# Patient Record
Sex: Female | Born: 1960 | Hispanic: No | Marital: Married
Health system: Southern US, Community
[De-identification: ages and names within clinical notes are randomized; demographics above are authoritative.]

---

## 1998-11-11 ENCOUNTER — Other Ambulatory Visit: Admission: RE | Admit: 1998-11-11 | Discharge: 1998-11-11 | Payer: Self-pay | Admitting: Obstetrics and Gynecology

## 2000-03-17 ENCOUNTER — Other Ambulatory Visit: Admission: RE | Admit: 2000-03-17 | Discharge: 2000-03-17 | Payer: Self-pay | Admitting: Obstetrics and Gynecology

## 2002-05-31 ENCOUNTER — Encounter: Payer: Self-pay | Admitting: Family Medicine

## 2002-05-31 ENCOUNTER — Encounter: Admission: RE | Admit: 2002-05-31 | Discharge: 2002-05-31 | Payer: Self-pay | Admitting: Family Medicine

## 2002-06-16 ENCOUNTER — Encounter: Admission: RE | Admit: 2002-06-16 | Discharge: 2002-06-16 | Payer: Self-pay | Admitting: Family Medicine

## 2002-06-16 ENCOUNTER — Encounter: Payer: Self-pay | Admitting: Family Medicine

## 2003-07-12 ENCOUNTER — Encounter: Admission: RE | Admit: 2003-07-12 | Discharge: 2003-07-12 | Payer: Self-pay | Admitting: Obstetrics and Gynecology

## 2003-07-12 ENCOUNTER — Encounter: Payer: Self-pay | Admitting: Obstetrics and Gynecology

## 2004-06-04 ENCOUNTER — Other Ambulatory Visit: Admission: RE | Admit: 2004-06-04 | Discharge: 2004-06-04 | Payer: Self-pay | Admitting: Obstetrics and Gynecology

## 2004-06-09 ENCOUNTER — Encounter: Admission: RE | Admit: 2004-06-09 | Discharge: 2004-06-09 | Payer: Self-pay | Admitting: Obstetrics and Gynecology

## 2005-07-17 ENCOUNTER — Encounter: Admission: RE | Admit: 2005-07-17 | Discharge: 2005-07-17 | Payer: Self-pay | Admitting: Obstetrics and Gynecology

## 2005-07-22 ENCOUNTER — Other Ambulatory Visit: Admission: RE | Admit: 2005-07-22 | Discharge: 2005-07-22 | Payer: Self-pay | Admitting: Obstetrics and Gynecology

## 2006-05-18 ENCOUNTER — Other Ambulatory Visit: Admission: RE | Admit: 2006-05-18 | Discharge: 2006-05-18 | Payer: Self-pay | Admitting: Obstetrics and Gynecology

## 2006-08-31 ENCOUNTER — Other Ambulatory Visit: Admission: RE | Admit: 2006-08-31 | Discharge: 2006-08-31 | Payer: Self-pay | Admitting: Obstetrics and Gynecology

## 2006-09-29 ENCOUNTER — Encounter: Admission: RE | Admit: 2006-09-29 | Discharge: 2006-09-29 | Payer: Self-pay | Admitting: Obstetrics and Gynecology

## 2007-09-06 ENCOUNTER — Other Ambulatory Visit: Admission: RE | Admit: 2007-09-06 | Discharge: 2007-09-06 | Payer: Self-pay | Admitting: Obstetrics and Gynecology

## 2008-09-10 ENCOUNTER — Other Ambulatory Visit: Admission: RE | Admit: 2008-09-10 | Discharge: 2008-09-10 | Payer: Self-pay | Admitting: Obstetrics and Gynecology

## 2008-10-29 ENCOUNTER — Encounter: Admission: RE | Admit: 2008-10-29 | Discharge: 2008-10-29 | Payer: Self-pay | Admitting: Obstetrics and Gynecology

## 2009-09-11 ENCOUNTER — Other Ambulatory Visit: Admission: RE | Admit: 2009-09-11 | Discharge: 2009-09-11 | Payer: Self-pay | Admitting: Obstetrics and Gynecology

## 2013-09-01 ENCOUNTER — Other Ambulatory Visit: Payer: Self-pay | Admitting: Family Medicine

## 2013-09-01 ENCOUNTER — Other Ambulatory Visit (HOSPITAL_COMMUNITY)
Admission: RE | Admit: 2013-09-01 | Discharge: 2013-09-01 | Disposition: A | Discharge status: Home / Self Care | Payer: BC Managed Care – PPO | Source: Ambulatory Visit | Attending: Family Medicine | Admitting: Family Medicine

## 2013-09-01 DIAGNOSIS — Z1151 Encounter for screening for human papillomavirus (HPV): Secondary | ICD-10-CM | POA: Insufficient documentation

## 2013-09-01 DIAGNOSIS — Z1231 Encounter for screening mammogram for malignant neoplasm of breast: Secondary | ICD-10-CM

## 2013-09-01 DIAGNOSIS — Z124 Encounter for screening for malignant neoplasm of cervix: Secondary | ICD-10-CM | POA: Insufficient documentation

## 2013-09-28 ENCOUNTER — Ambulatory Visit
Admission: RE | Admit: 2013-09-28 | Discharge: 2013-09-28 | Disposition: A | Discharge status: Home / Self Care | Payer: BC Managed Care – PPO | Source: Ambulatory Visit | Attending: Family Medicine | Admitting: Family Medicine

## 2013-09-28 DIAGNOSIS — Z1231 Encounter for screening mammogram for malignant neoplasm of breast: Secondary | ICD-10-CM

## 2014-05-31 ENCOUNTER — Other Ambulatory Visit: Payer: Self-pay | Admitting: Family Medicine

## 2014-05-31 DIAGNOSIS — R0989 Other specified symptoms and signs involving the circulatory and respiratory systems: Secondary | ICD-10-CM

## 2014-06-11 ENCOUNTER — Ambulatory Visit
Admission: RE | Admit: 2014-06-11 | Discharge: 2014-06-11 | Disposition: A | Discharge status: Home / Self Care | Payer: BC Managed Care – PPO | Source: Ambulatory Visit | Attending: Family Medicine | Admitting: Family Medicine

## 2014-06-11 DIAGNOSIS — R0989 Other specified symptoms and signs involving the circulatory and respiratory systems: Secondary | ICD-10-CM

## 2015-10-23 ENCOUNTER — Other Ambulatory Visit: Payer: Self-pay | Admitting: Family Medicine

## 2015-10-23 DIAGNOSIS — Z1231 Encounter for screening mammogram for malignant neoplasm of breast: Secondary | ICD-10-CM

## 2015-11-05 ENCOUNTER — Ambulatory Visit
Admission: RE | Admit: 2015-11-05 | Discharge: 2015-11-05 | Disposition: A | Discharge status: Home / Self Care | Payer: BC Managed Care – PPO | Source: Ambulatory Visit | Attending: Family Medicine | Admitting: Family Medicine

## 2015-11-05 DIAGNOSIS — Z1231 Encounter for screening mammogram for malignant neoplasm of breast: Secondary | ICD-10-CM

## 2017-03-25 ENCOUNTER — Other Ambulatory Visit: Payer: Self-pay | Admitting: Family Medicine

## 2017-03-25 DIAGNOSIS — M25561 Pain in right knee: Secondary | ICD-10-CM

## 2017-04-11 ENCOUNTER — Ambulatory Visit
Admission: RE | Admit: 2017-04-11 | Discharge: 2017-04-11 | Disposition: A | Discharge status: Home / Self Care | Payer: BC Managed Care – PPO | Source: Ambulatory Visit | Attending: Family Medicine | Admitting: Family Medicine

## 2017-04-11 DIAGNOSIS — M25561 Pain in right knee: Secondary | ICD-10-CM

## 2017-08-11 NOTE — Progress Notes (Signed)
Ms. Malissa HippoKelkar has gotten a flu shot today to LT deltoid at [facility] Lot# [id number] Mfg: [employer] NDC: [phone] [id number] Exp. 04/10/18

## 2018-02-22 ENCOUNTER — Other Ambulatory Visit (HOSPITAL_COMMUNITY)
Admission: RE | Admit: 2018-02-22 | Discharge: 2018-02-22 | Disposition: A | Discharge status: Home / Self Care | Payer: BC Managed Care – PPO | Source: Ambulatory Visit | Attending: Family Medicine | Admitting: Family Medicine

## 2018-02-22 ENCOUNTER — Other Ambulatory Visit: Payer: Self-pay | Admitting: Family Medicine

## 2018-02-22 DIAGNOSIS — Z1231 Encounter for screening mammogram for malignant neoplasm of breast: Secondary | ICD-10-CM

## 2018-02-22 DIAGNOSIS — Z124 Encounter for screening for malignant neoplasm of cervix: Secondary | ICD-10-CM | POA: Diagnosis present

## 2018-02-24 LAB — CYTOLOGY - PAP
Diagnosis: NEGATIVE
HPV: NOT DETECTED

## 2018-03-15 ENCOUNTER — Ambulatory Visit
Admission: RE | Admit: 2018-03-15 | Discharge: 2018-03-15 | Disposition: A | Discharge status: Home / Self Care | Payer: BC Managed Care – PPO | Source: Ambulatory Visit | Attending: Family Medicine | Admitting: Family Medicine

## 2018-03-15 DIAGNOSIS — Z1231 Encounter for screening mammogram for malignant neoplasm of breast: Secondary | ICD-10-CM

## 2018-09-06 NOTE — Progress Notes (Signed)
Ms Sandra Cherry received her flu shot on 09/02/18 to her LT deltoid by the undersigned at the [facility].  Lot#[id number] NDC:[id number] Mfg: [employer] Exp: 04/11/19

## 2019-11-23 ENCOUNTER — Ambulatory Visit: Payer: BC Managed Care – PPO

## 2020-04-22 ENCOUNTER — Other Ambulatory Visit: Payer: Self-pay | Admitting: Internal Medicine

## 2020-04-22 DIAGNOSIS — E785 Hyperlipidemia, unspecified: Secondary | ICD-10-CM

## 2020-05-15 ENCOUNTER — Ambulatory Visit
Admission: RE | Admit: 2020-05-15 | Discharge: 2020-05-15 | Disposition: A | Discharge status: Home / Self Care | Payer: BC Managed Care – PPO | Source: Ambulatory Visit | Attending: Internal Medicine | Admitting: Internal Medicine

## 2020-05-15 DIAGNOSIS — E785 Hyperlipidemia, unspecified: Secondary | ICD-10-CM

## 2020-10-10 ENCOUNTER — Other Ambulatory Visit: Payer: BC Managed Care – PPO

## 2021-09-10 ENCOUNTER — Other Ambulatory Visit: Payer: Self-pay | Admitting: Internal Medicine

## 2021-09-10 DIAGNOSIS — Z1231 Encounter for screening mammogram for malignant neoplasm of breast: Secondary | ICD-10-CM

## 2021-09-15 ENCOUNTER — Encounter: Payer: Self-pay | Admitting: Radiology

## 2021-09-15 ENCOUNTER — Ambulatory Visit
Admission: RE | Admit: 2021-09-15 | Discharge: 2021-09-15 | Disposition: A | Discharge status: Home / Self Care | Payer: BC Managed Care – PPO | Source: Ambulatory Visit | Attending: Internal Medicine | Admitting: Internal Medicine

## 2021-09-15 DIAGNOSIS — Z1231 Encounter for screening mammogram for malignant neoplasm of breast: Secondary | ICD-10-CM

## 2022-10-28 ENCOUNTER — Other Ambulatory Visit: Payer: Self-pay | Admitting: Internal Medicine

## 2022-10-28 DIAGNOSIS — Z0000 Encounter for general adult medical examination without abnormal findings: Secondary | ICD-10-CM

## 2023-02-19 IMAGING — MG MM DIGITAL SCREENING BILAT W/ TOMO AND CAD
8 series · 9 of 24 positions shown · non-contrast
Comparison: Previous exam(s).

CLINICAL DATA: Screening.

EXAM:
DIGITAL SCREENING BILATERAL MAMMOGRAM WITH TOMOSYNTHESIS AND CAD
TECHNIQUE: Bilateral screening digital craniocaudal and mediolateral oblique
mammograms were obtained. Bilateral screening digital breast
tomosynthesis was performed. The images were evaluated with
computer-aided detection.

[L CC synth-2D]
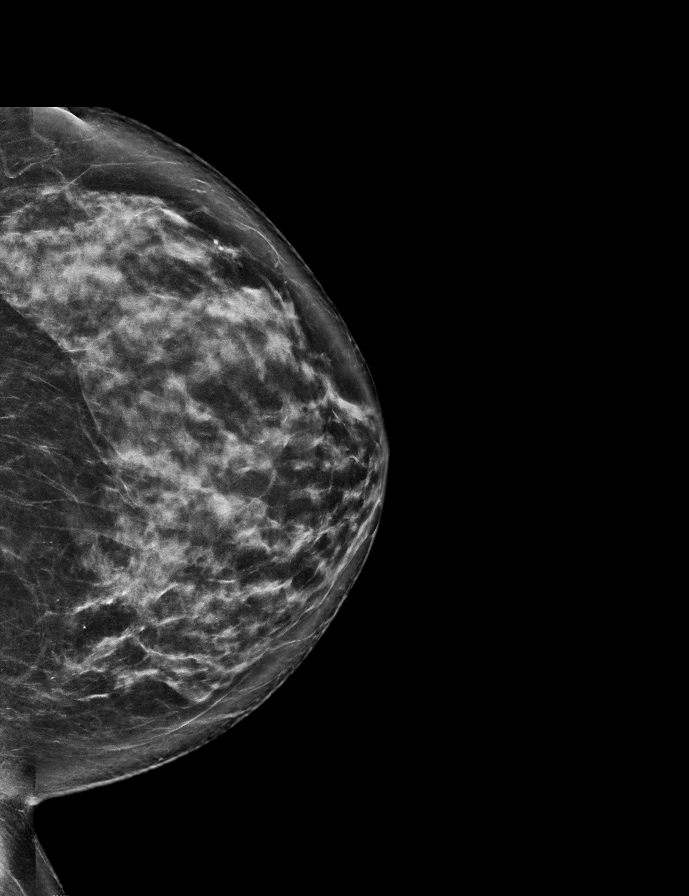

[L MLO synth-2D]
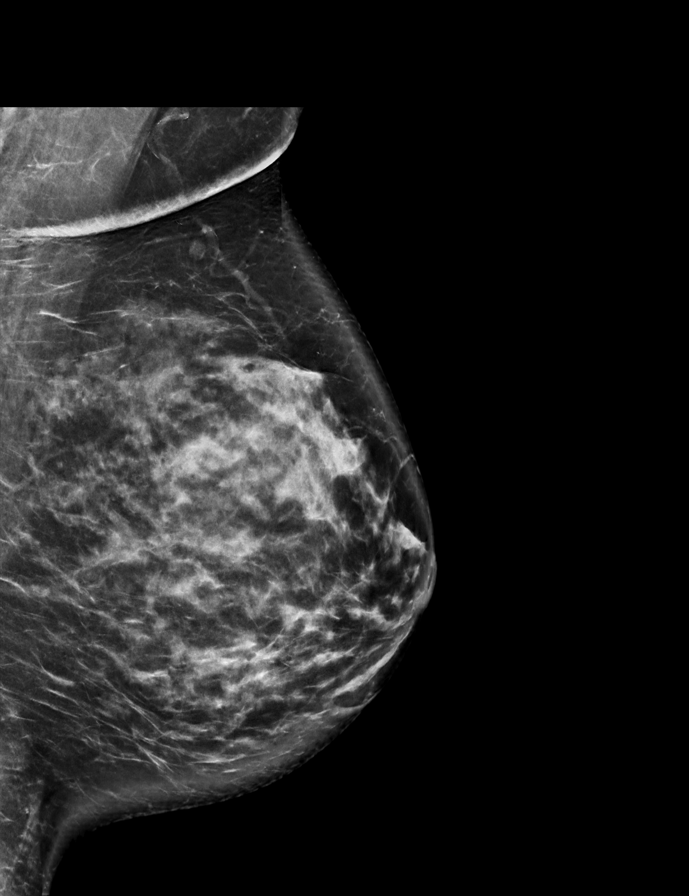

[R CC synth-2D]
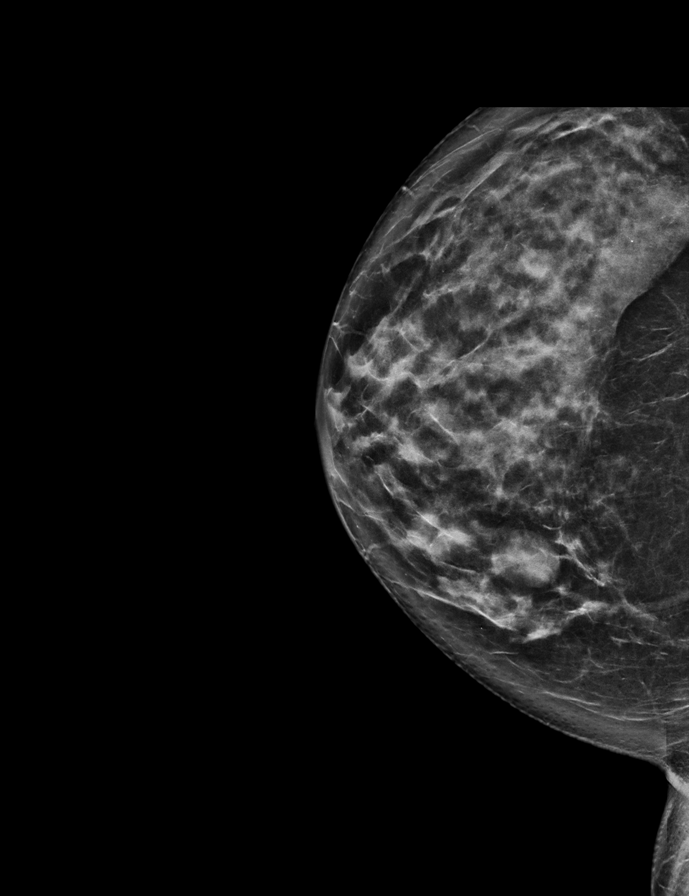

[R MLO synth-2D]
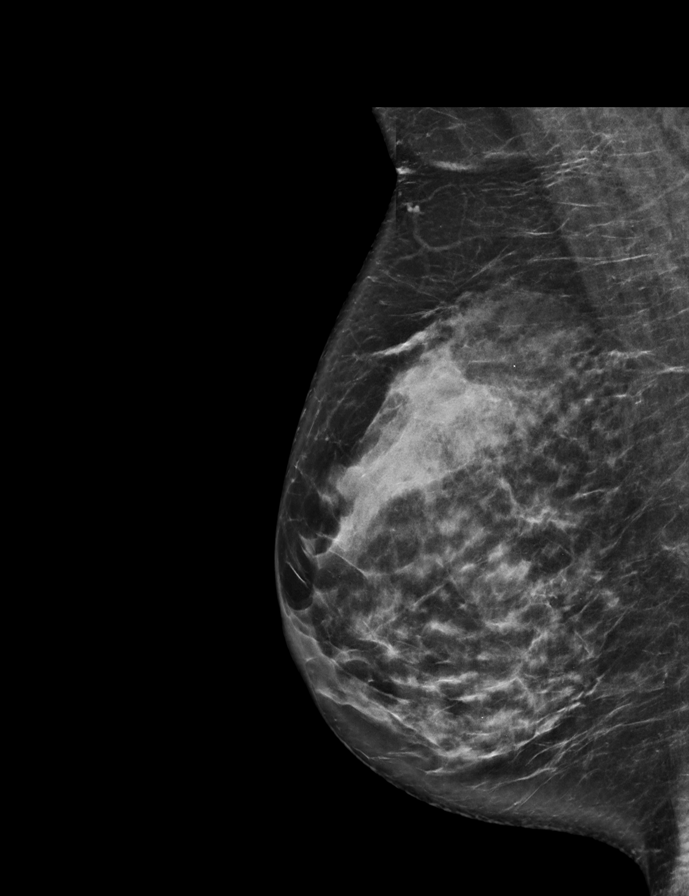

[R MLO tomo · 2 of 78 frames shown]
[frame 26/78]
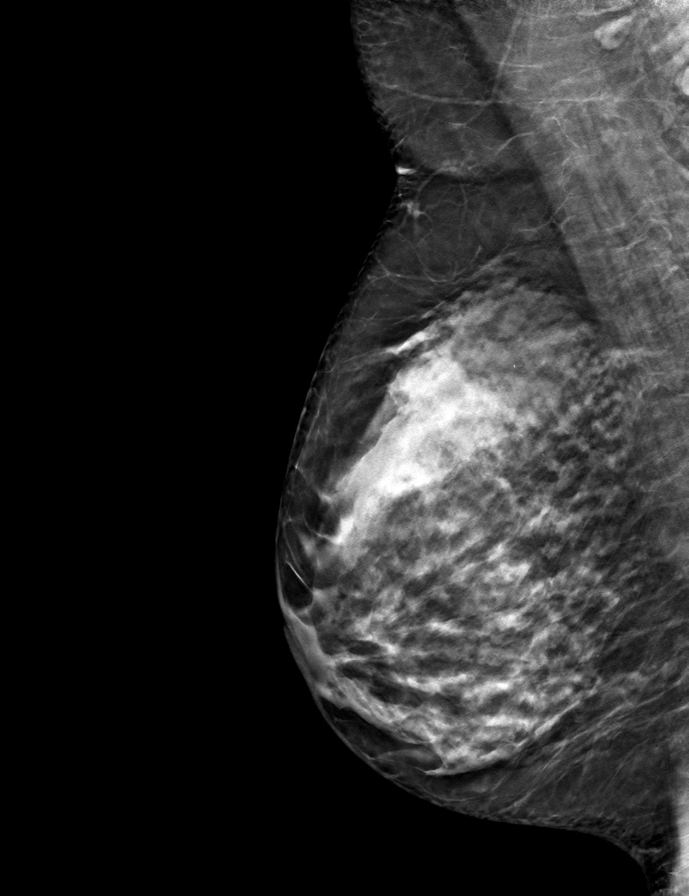
[frame 39/78]
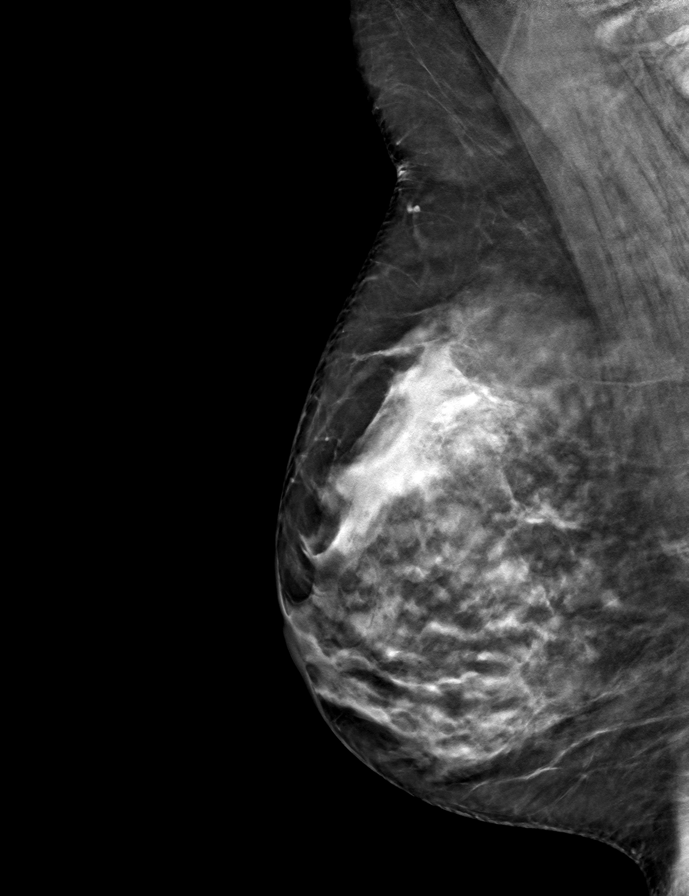

[L CC tomo · tomo slice 40/79.0]
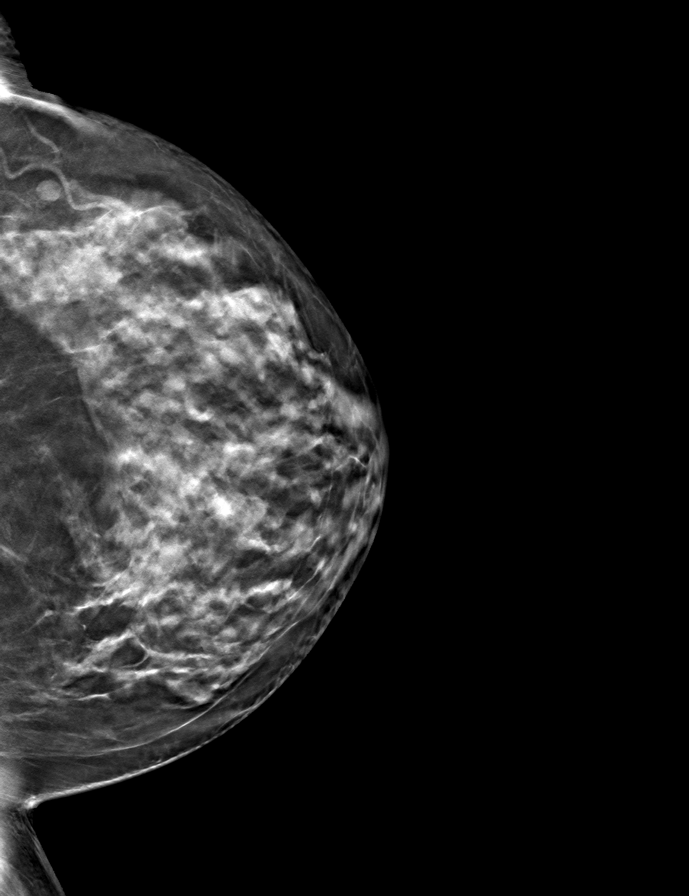

[R CC tomo · tomo slice 39/76.0]
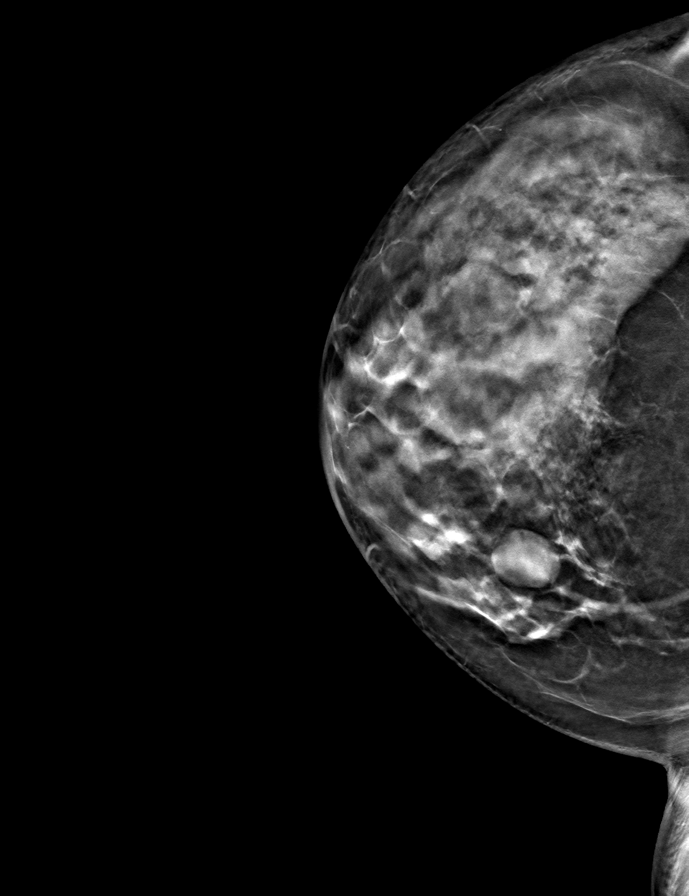

[L MLO tomo · tomo slice 44/87.0]
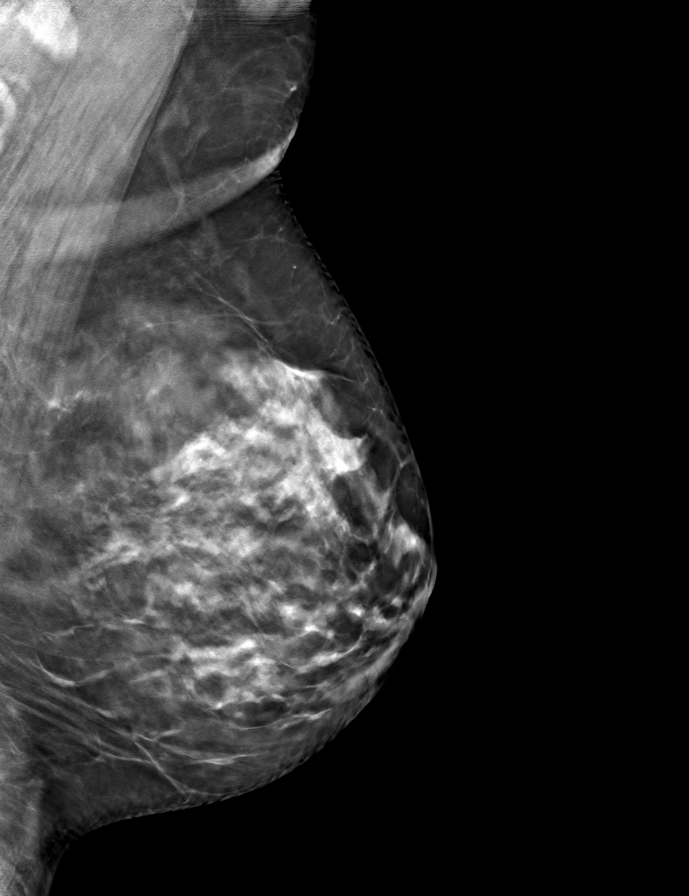

[9 of 24 positions shown; findings below may reference images not displayed]

ACR Breast Density Category c: The breast tissue is heterogeneously
dense, which may obscure small masses.
FINDINGS: There are no findings suspicious for malignancy.
IMPRESSION: No mammographic evidence of malignancy. A result letter of this
screening mammogram will be mailed directly to the patient.

RECOMMENDATION:
Screening mammogram in one year. (Code:Q3-W-BC3)

BI-RADS CATEGORY  1: Negative.

## 2024-06-12 ENCOUNTER — Emergency Department (HOSPITAL_BASED_OUTPATIENT_CLINIC_OR_DEPARTMENT_OTHER)
Admission: EM | Admit: 2024-06-12 | Discharge: 2024-06-12 | Disposition: A | Discharge status: Home / Self Care | Attending: Emergency Medicine | Admitting: Emergency Medicine

## 2024-06-12 ENCOUNTER — Other Ambulatory Visit: Payer: Self-pay

## 2024-06-12 DIAGNOSIS — S46812A Strain of other muscles, fascia and tendons at shoulder and upper arm level, left arm, initial encounter: Secondary | ICD-10-CM | POA: Insufficient documentation

## 2024-06-12 DIAGNOSIS — M25512 Pain in left shoulder: Secondary | ICD-10-CM | POA: Diagnosis present

## 2024-06-12 DIAGNOSIS — Y92410 Unspecified street and highway as the place of occurrence of the external cause: Secondary | ICD-10-CM | POA: Diagnosis not present

## 2024-06-12 DIAGNOSIS — R030 Elevated blood-pressure reading, without diagnosis of hypertension: Secondary | ICD-10-CM | POA: Diagnosis not present

## 2024-06-12 MED ORDER — IBUPROFEN 400 MG PO TABS
400.0000 mg | ORAL_TABLET | Freq: Once | ORAL | Status: AC
  Administered 2024-06-12: 400 mg via ORAL
  Filled 2024-06-12: qty 1

## 2024-06-12 MED ORDER — ACETAMINOPHEN 500 MG PO TABS
1000.0000 mg | ORAL_TABLET | Freq: Once | ORAL | Status: AC
  Administered 2024-06-12: 500 mg via ORAL
  Filled 2024-06-12: qty 2

## 2024-06-12 MED ORDER — METHOCARBAMOL 750 MG PO TABS: 750.0000 mg | ORAL_TABLET | Freq: Three times a day (TID) | ORAL | 0 refills | Status: AC | PRN

## 2024-06-12 NOTE — ED Notes (Signed)
 Pt refused second tylenol . Pt only took 500mg  of tylenol 
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
## 2024-06-12 NOTE — ED Notes (Signed)
 Pt sitting in chair, pt reports a slight decrease in pain, read and reviewed d/c instructions, pt verbalized, pt ambulatory from department.
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
## 2024-06-12 NOTE — Discharge Instructions (Addendum)
 It was our pleasure to provide your ER care today - we hope that you feel better.  Take acetaminophen  or ibuprofen  as need for pain. You may also take robaxin  as need for muscle pain/spasm - no driving when taking.   Follow up with primary care doctor in 1-2 weeks if symptoms fail to improve/resolve. Also follow up with primary care doctor in the next 1-2 weeks regarding your blood pressure that is high today.   Return to ER if worse, new symptoms, new/severe pain, trouble breathing, severe headache, numbness/weakness, or other emergency concern.
